# Patient Record
Sex: Female | Born: 1955 | Race: White | Hispanic: No | Marital: Married | Smoking: Never smoker
Health system: Southern US, Community
[De-identification: ages and names within clinical notes are randomized; demographics above are authoritative.]

## PROBLEM LIST (undated history)

## (undated) HISTORY — PX: TONSILLECTOMY: SHX5217

## (undated) HISTORY — PX: TUBAL LIGATION: SHX77

---

## 2005-12-14 ENCOUNTER — Encounter: Admission: RE | Admit: 2005-12-14 | Discharge: 2005-12-14 | Payer: Self-pay | Admitting: Obstetrics and Gynecology

## 2006-01-25 ENCOUNTER — Encounter: Admission: RE | Admit: 2006-01-25 | Discharge: 2006-01-25 | Payer: Self-pay | Admitting: Obstetrics and Gynecology

## 2006-01-31 ENCOUNTER — Encounter: Admission: RE | Admit: 2006-01-31 | Discharge: 2006-01-31 | Payer: Self-pay | Admitting: Obstetrics and Gynecology

## 2006-02-22 ENCOUNTER — Encounter: Admission: RE | Admit: 2006-02-22 | Discharge: 2006-02-22 | Payer: Self-pay | Admitting: Obstetrics and Gynecology

## 2006-04-02 IMAGING — US IR TRANSCATH EMBOLIZATION NON-NEURO EA OP FIELD
1 series · 5 of 5 positions shown · non-contrast
Comparison: none

CLINICAL DATA: Symptomatic left lower extremity varicose veins secondary to
valvular incompetence and reflux in short and greater saphenous veins.

[Series 1: unknown · 0.09mm/px · 5 of 5 slices shown]
[im 1/5]
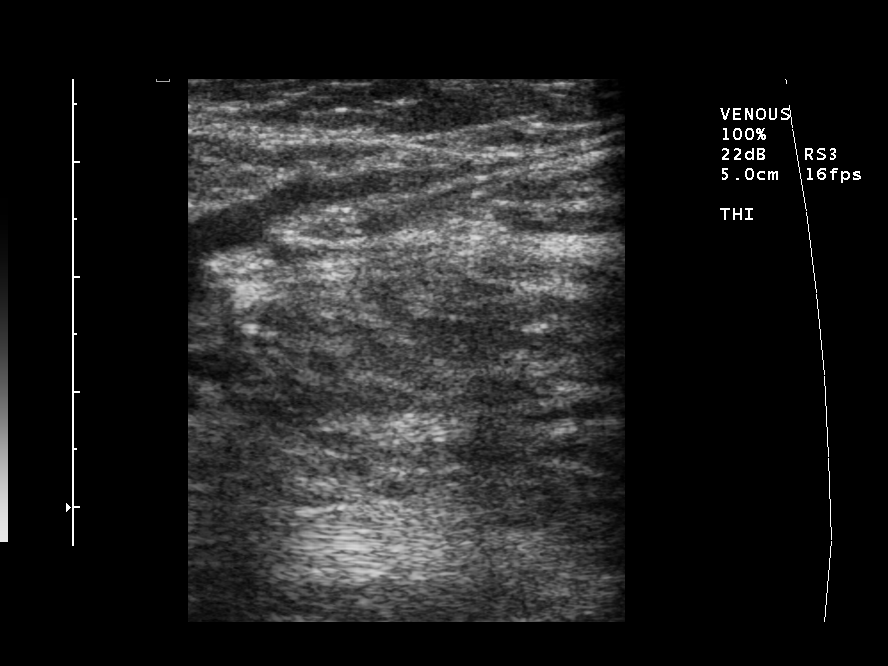
[im 2/5]
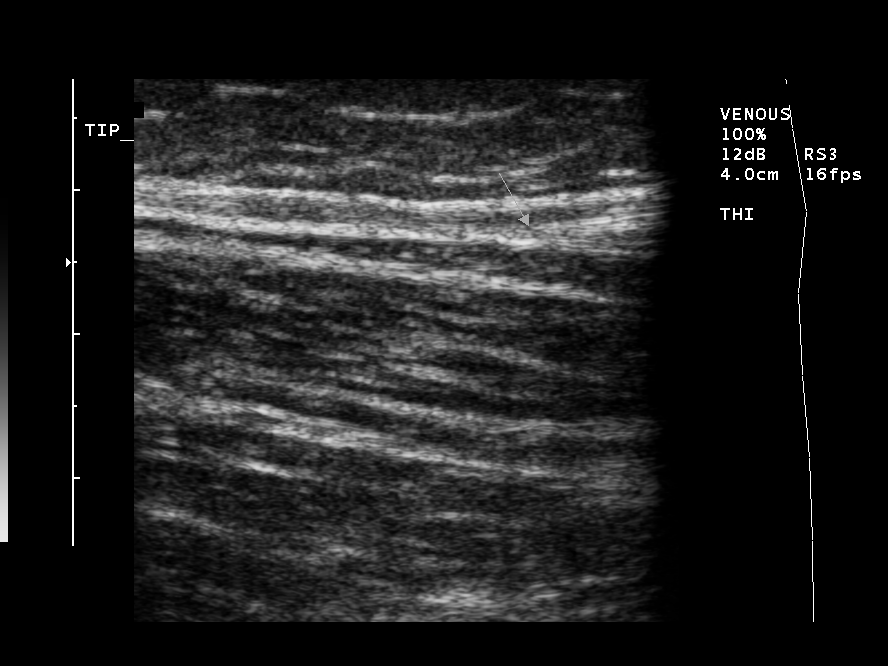
[im 3/5]
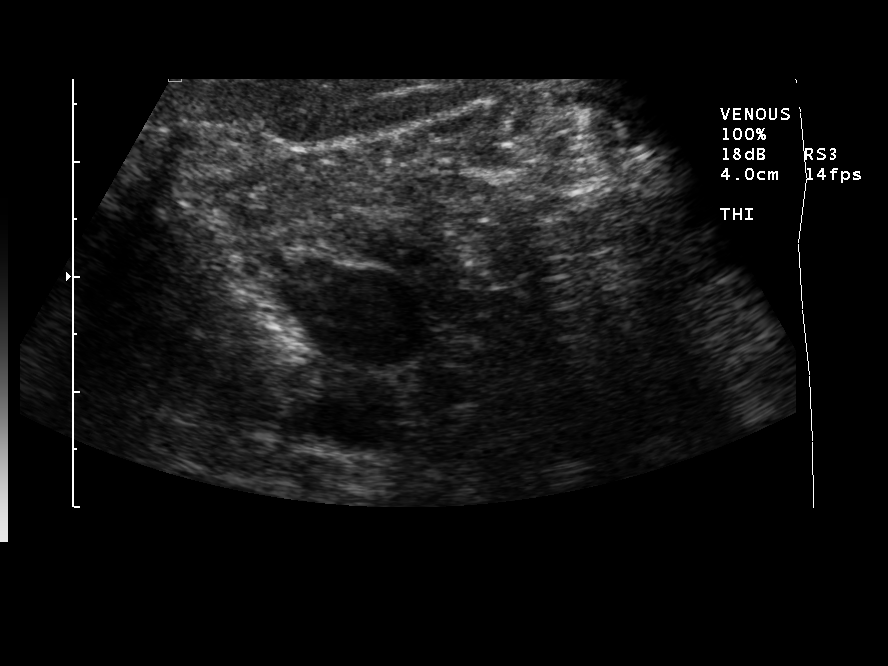
[im 4/5]
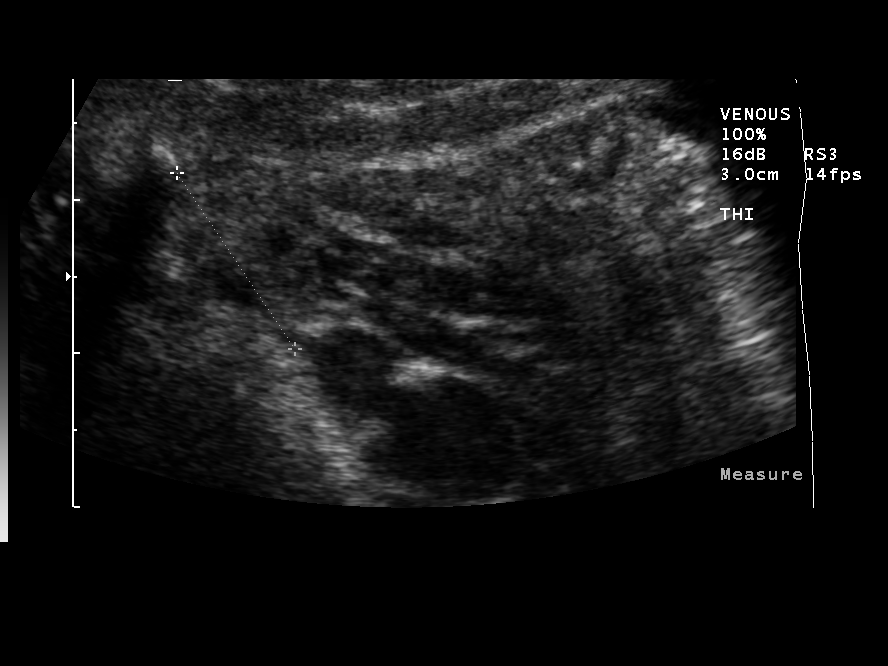
[im 5/5]
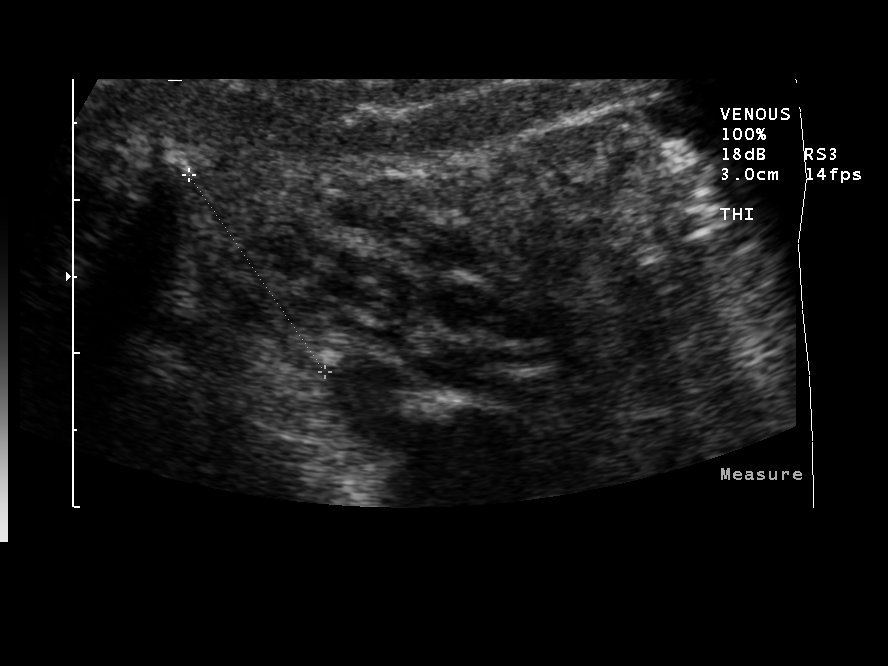

[5 of 5 positions shown; findings below may reference images not displayed]

TRANSCATHETER LASER OCCLUSION  LEFT  GREATER AND SMALL SAPHENOUS VEINS WITH
ULTRASOUND GUIDANCE:

Survey ultrasound of the leg was performed and the course of the small and
greater saphenous veins was marked on the skin. Overlying skin prepped with
Betadine, draped in usual sterile fashion. After local anesthetic using 1%
lidocaine, the greater saphenous vein was accessed just above the ankle level
under ultrasound with a 21-gauge micropuncture needle. A 018 guidewire advanced
easily. This was exchanged using a transitional dilator for a 035 J wire. Over
this, the 6 French delivery sheath was advanced beyond the saphenofemoral
junction. The laser fiber was advanced and positioned greater than 15 mm  from
the saphenofemoral junction. The small saphenous vein was accessed in similar
fashion under ultrasound with a 21-gauge micropuncture needle, exchanged over a
micropuncture wire for transitional dilator. Tumescent dilute 0.1% lidocaine 350
mL used along the planned GSV treatment length. Ultrasound was used to confirm
appropriate tumescent anesthesia and to verify that all segments were at least 1
cm from the skin surface. The laser fiber was activated while being withdrawn
over the treatment length, delivering 1982 joules over 304 seconds. The catheter
and sheath were removed and hemostasis easily achieved at the site. 
The transitional dilator in the small saphenous vein was used for advancement of
the 035 J wire. Over this, the 6 French delivery sheath was advanced beyond to
just below the saphenopopliteal junction. The laser fiber was advanced and
positioned greater than 15 mm  from the saphenopopliteal junction.   Tumescent
dilute 0.1% lidocaine 150 mL used along the planned SSV treatment length.
Ultrasound was used to confirm appropriate tumescent anesthesia and to verify
that all segments were at least 1 cm from the skin surface. The laser fiber was
activated while being withdrawn over the treatment length, delivering 1868
joules over 171 seconds. The catheter and sheath were removed and hemostasis
easily achieved at the site.
Patient was placed in graduated compression stockings and ambulated for 20
minutes without difficulty. Patient tolerated the procedure well, with no
immediate complication.
IMPRESSION: 1. Technically successful transcatheter laser occlusion of left small and
greater saphenous veins. Patient will followup in clinic in one week. Patient
knows to telephone should there be any interval questions or problems.

## 2006-04-08 IMAGING — US US EXTREM LOW VENOUS*L*
1 series · 14 of 24 positions shown · non-contrast
Comparison: none

CLINICAL DATA: One week status post left GSV and SSV transcatheter laser occlusion for venous insufficiency and varicose veins.
LEFT LOWER EXTREMITY VENOUS DOPPLER ULTRASOUND:
TECHNIQUE: Gray-scale sonography with compression as well as color and duplex Doppler ultrasound were performed to evaluate the deep and superficial venous systems from the level of the common femoral vein through the popliteal and proximal calf veins.

[Series 1: unknown · 14 of 30 slices shown]
[im 1/30]
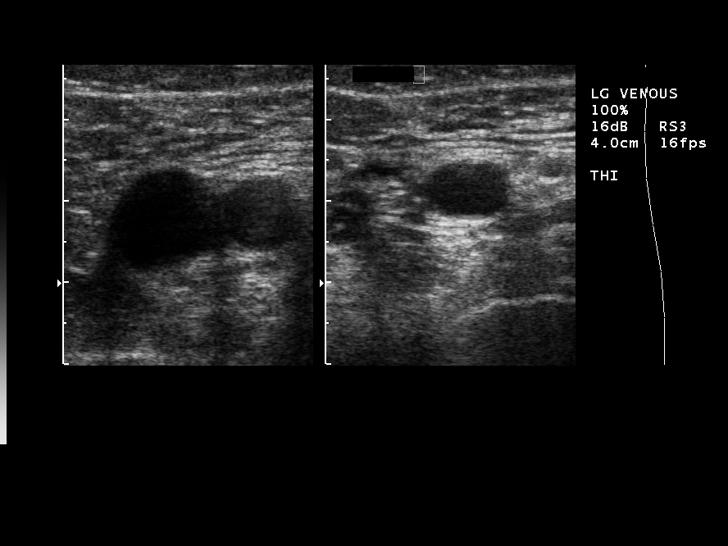
[im 3/30]
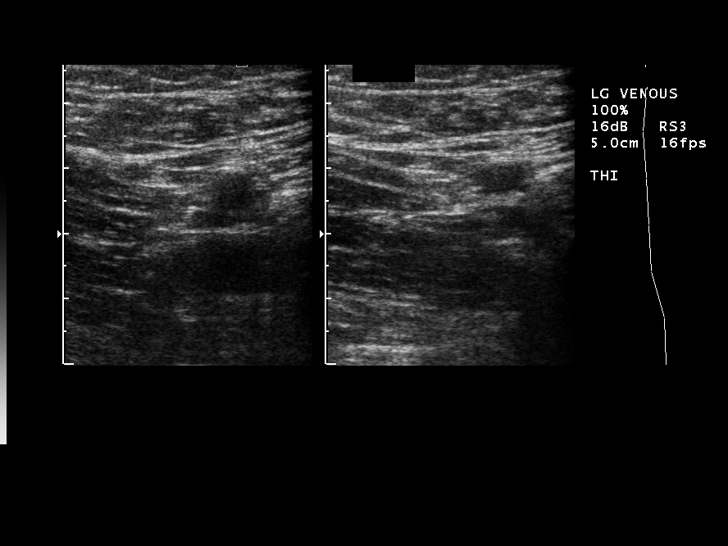
[im 6/30]
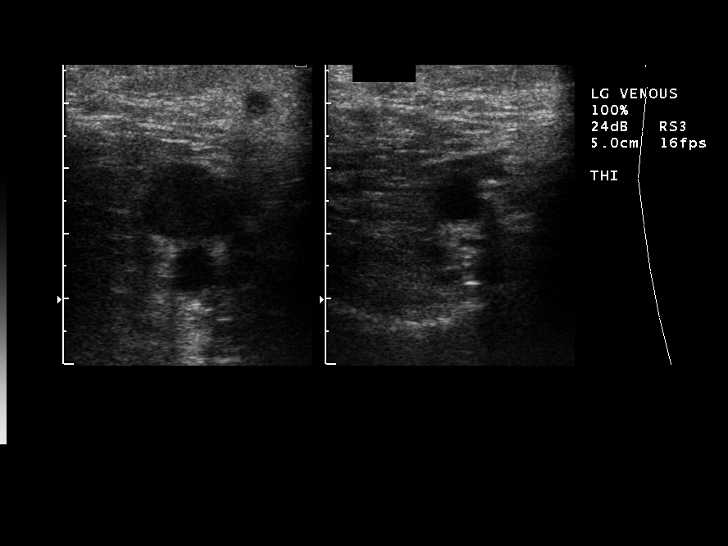
[im 8/30]
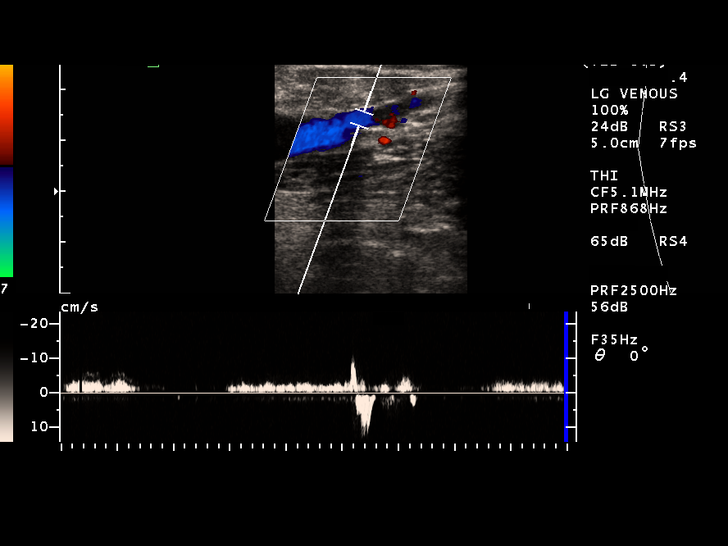
[im 9/30]
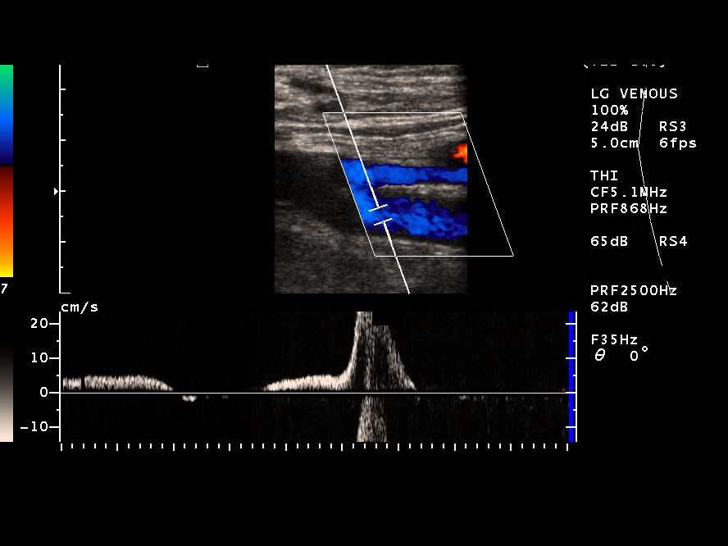
[im 12/30]
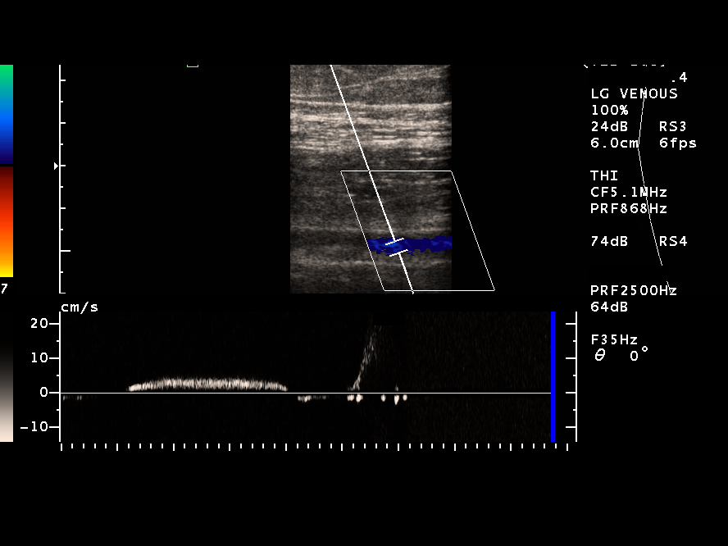
[im 14/30]
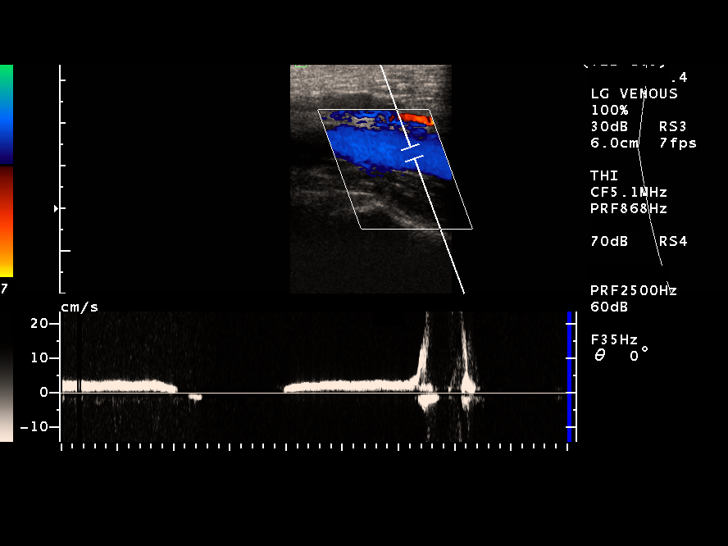
[im 16/30]
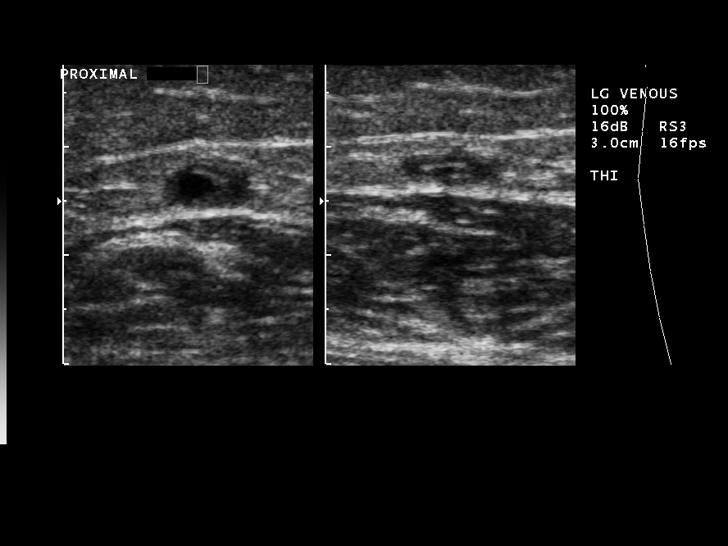
[im 18/30]
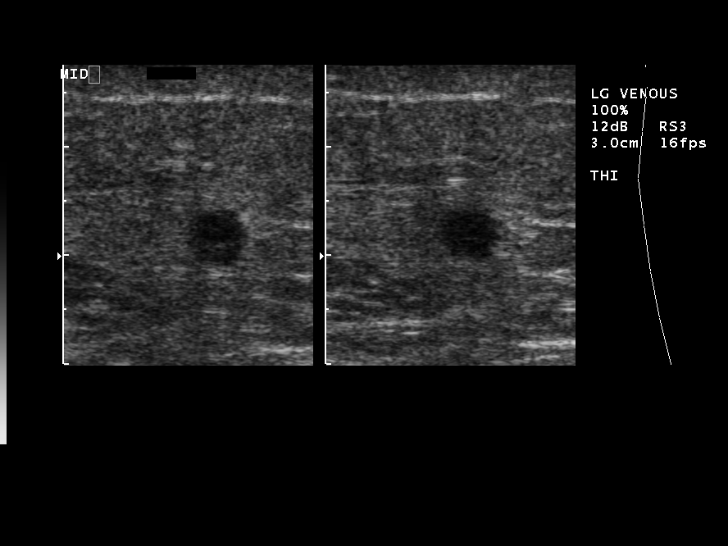
[im 21/30]
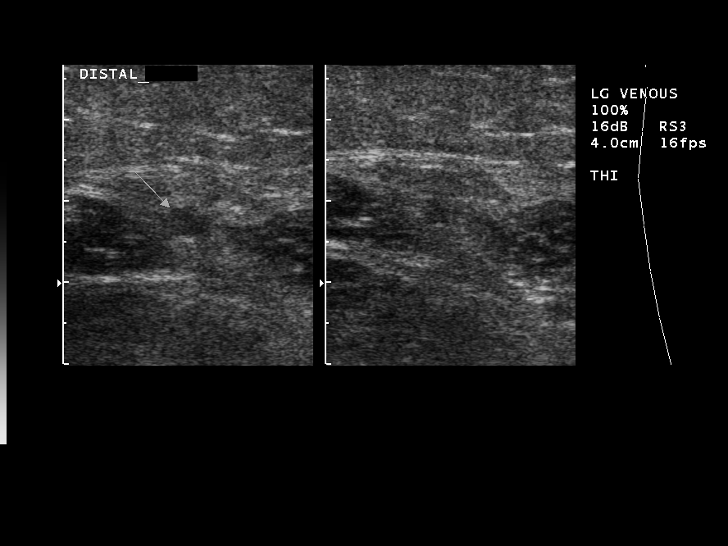
[im 23/30]
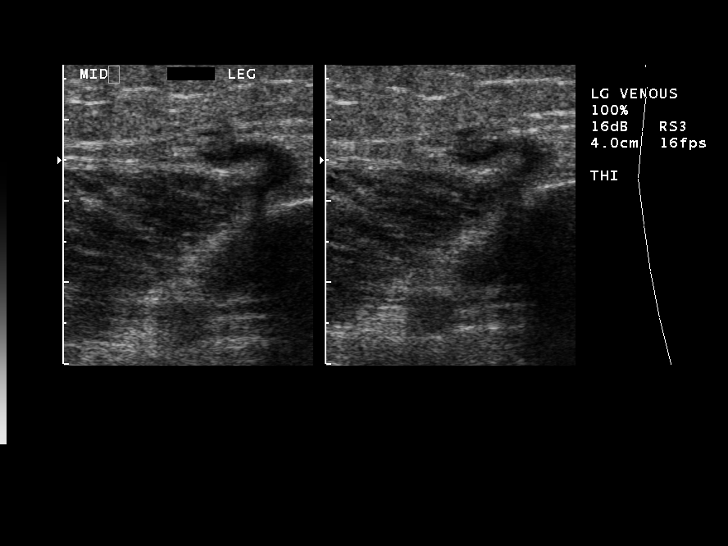
[im 24/30]
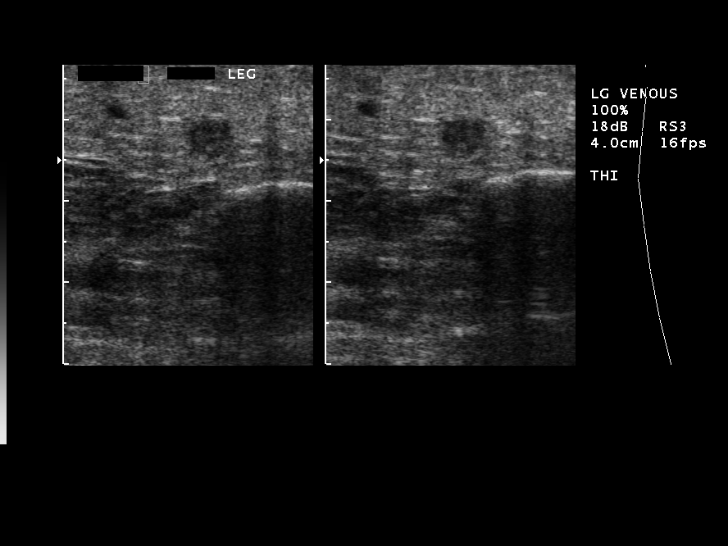
[im 27/30]
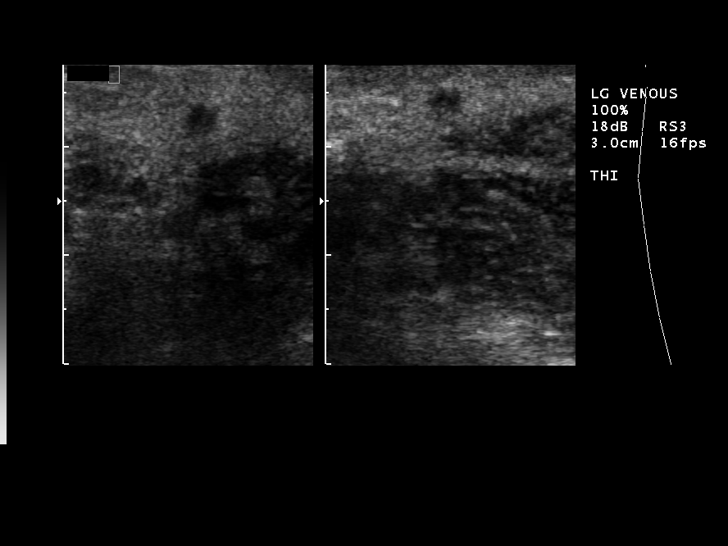
[im 30/30]
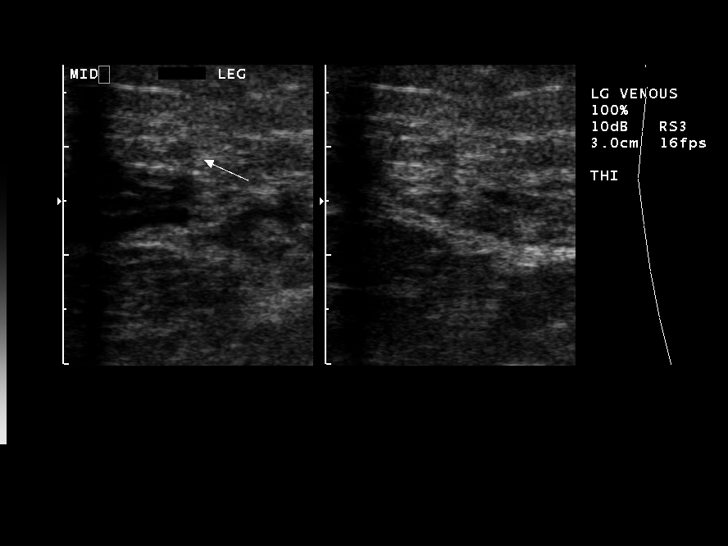

[14 of 24 positions shown; findings below may reference images not displayed]

FINDINGS: The left common femoral, femoral, and popliteal veins demonstrate normal compressibility and augmentation without DVT.  The treated segments of the left GSV and SSV remain occluded.   The varicose veins are beginning to decompress.  No delayed complication, or early recanalization.
IMPRESSION: 1.  Status post left GSV and SSV transcatheter laser occlusion.   The treated segments remain occluded at one week.
2.  No DVT.

## 2006-07-27 ENCOUNTER — Encounter: Admission: RE | Admit: 2006-07-27 | Discharge: 2006-07-27 | Payer: Self-pay | Admitting: Interventional Radiology

## 2006-10-02 IMAGING — US US EXTREM LOW VENOUS*L*
1 series · 14 of 23 positions shown · non-contrast
Comparison: none

CLINICAL DATA: Symptomatic left leg varicose veins, now 6 months status post
transcatheter laser occlusion of the   greater and small saphenous veins.

Left lower extremity venous Doppler ultrasound:
TECHNIQUE: Gray-scale sonography with compression as well as color and duplex
Doppler ultrasound were performed to evaluate   the saphenous vein and the deep
venous system from the level of the common femoral vein through the popliteal
and proximal calf veins.

[Series 1: unknown · 14 of 23 slices shown]
[im 1/23]
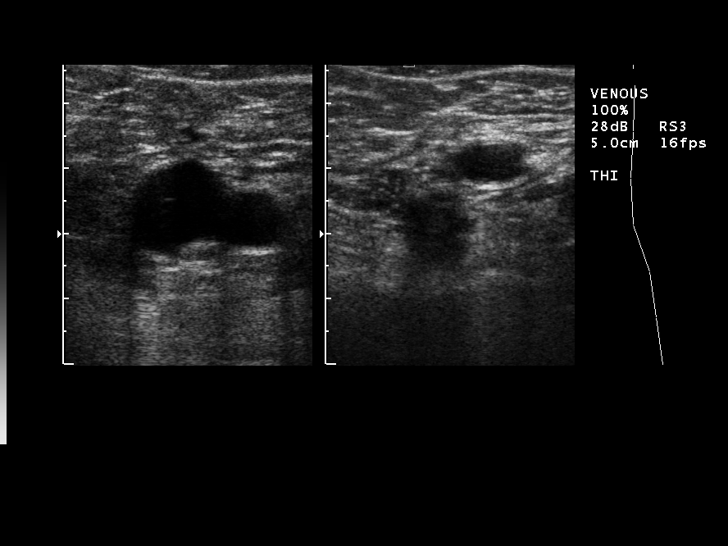
[im 3/23]
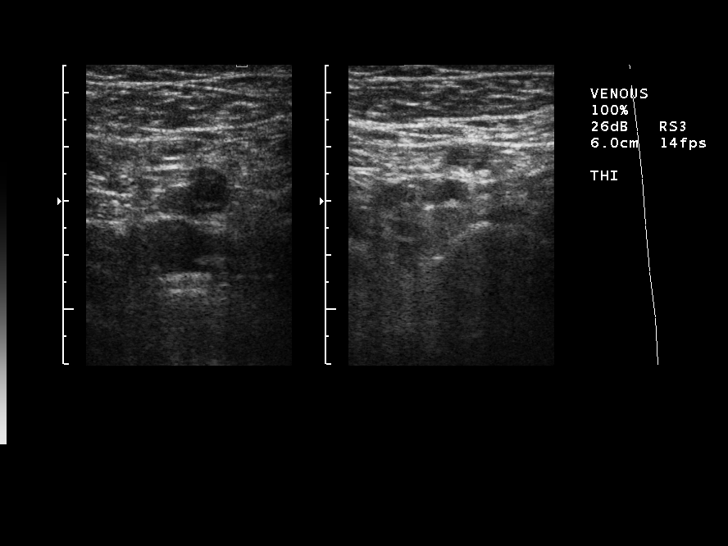
[im 5/23]
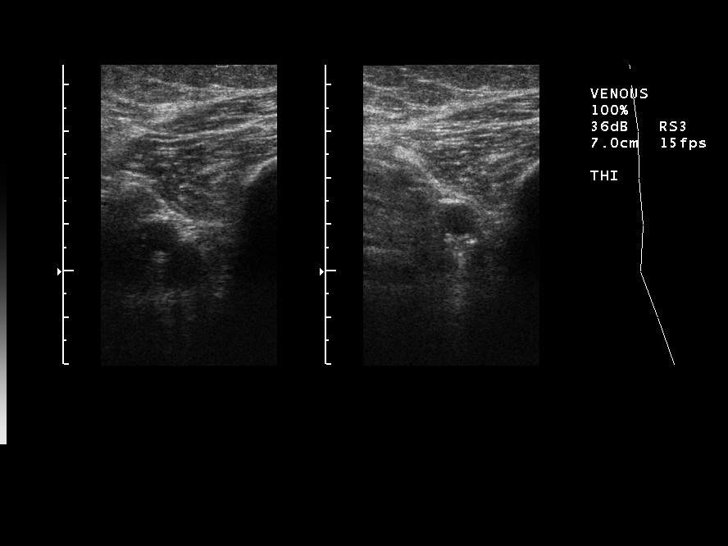
[im 6/23]
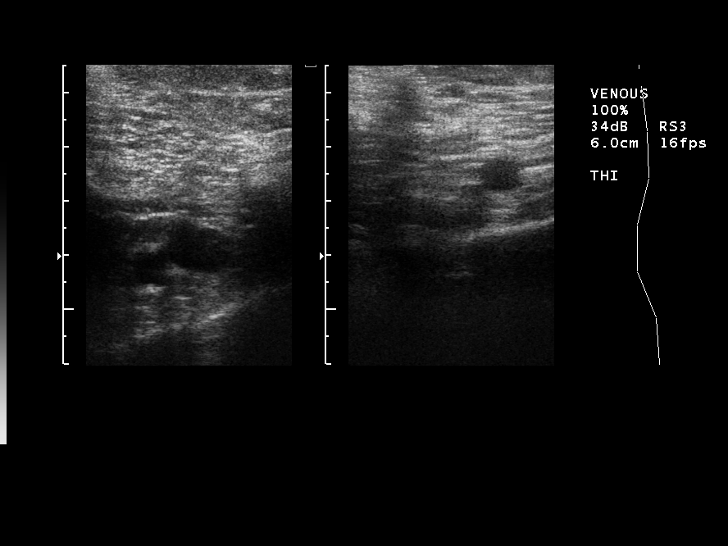
[im 8/23]
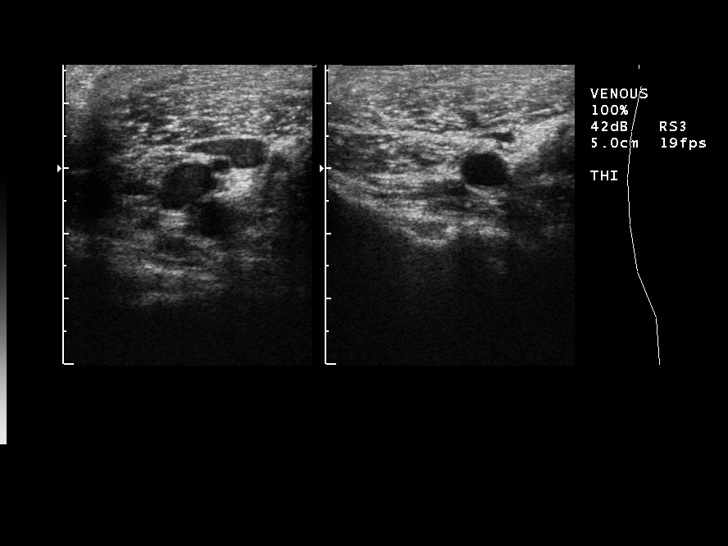
[im 10/23]
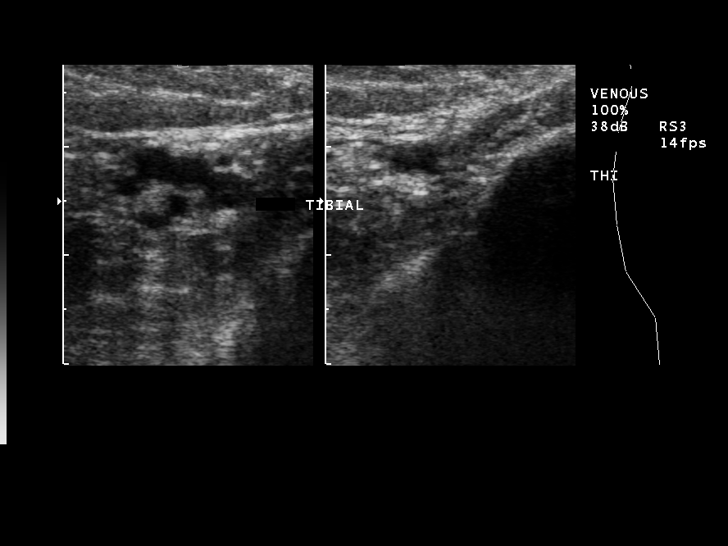
[im 11/23]
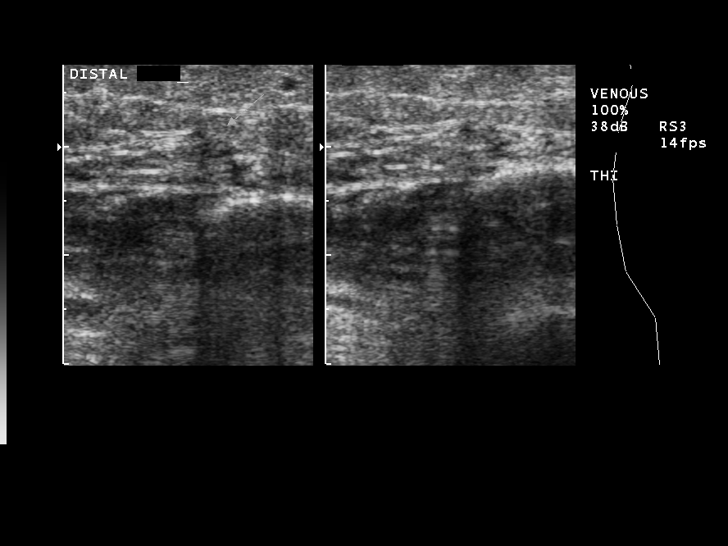
[im 13/23]
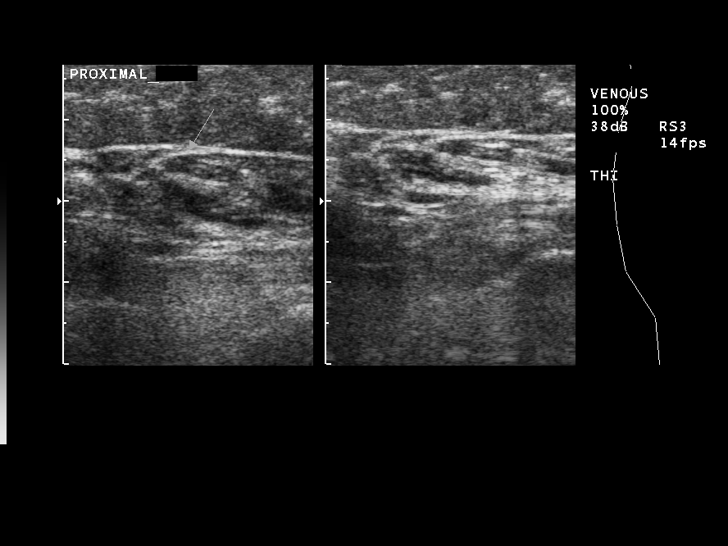
[im 14/23]
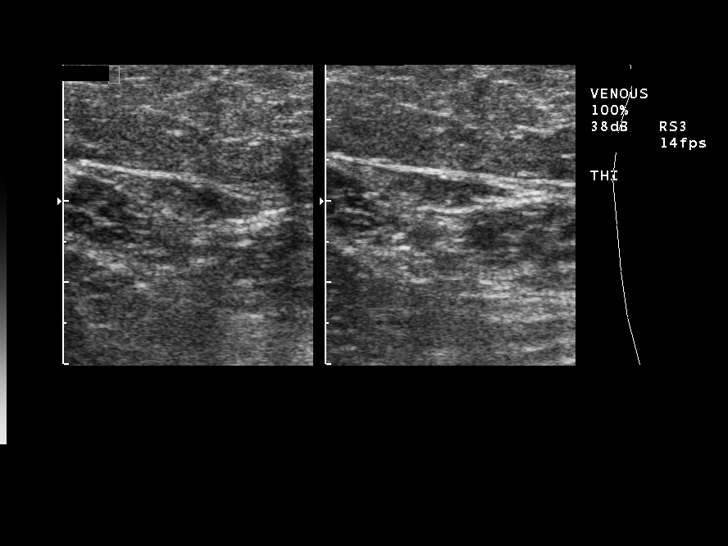
[im 16/23]
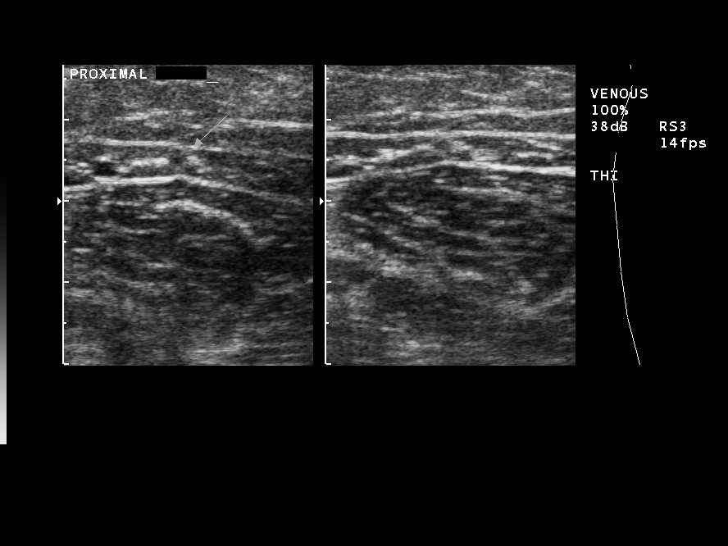
[im 18/23]
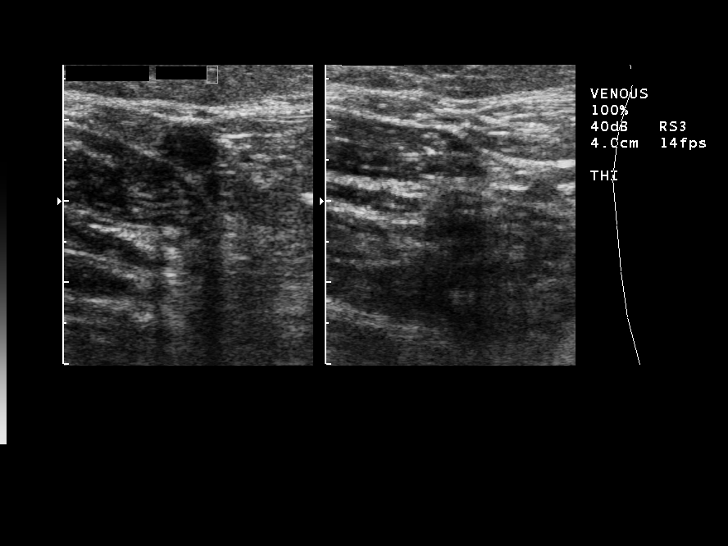
[im 19/23]
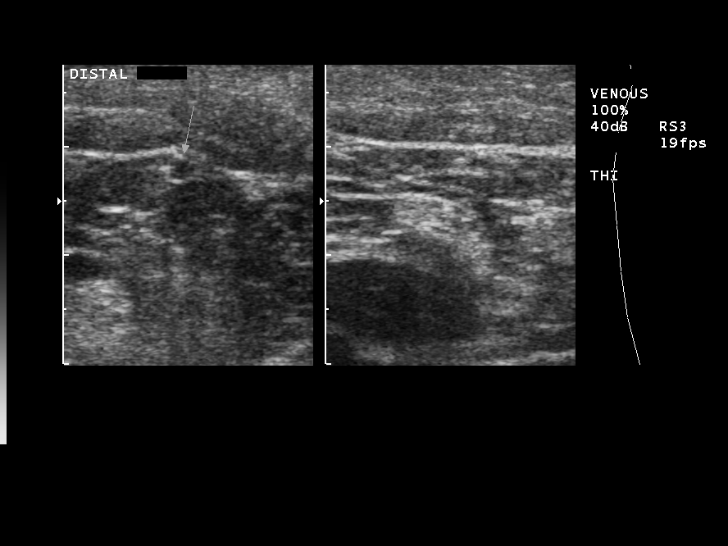
[im 21/23]
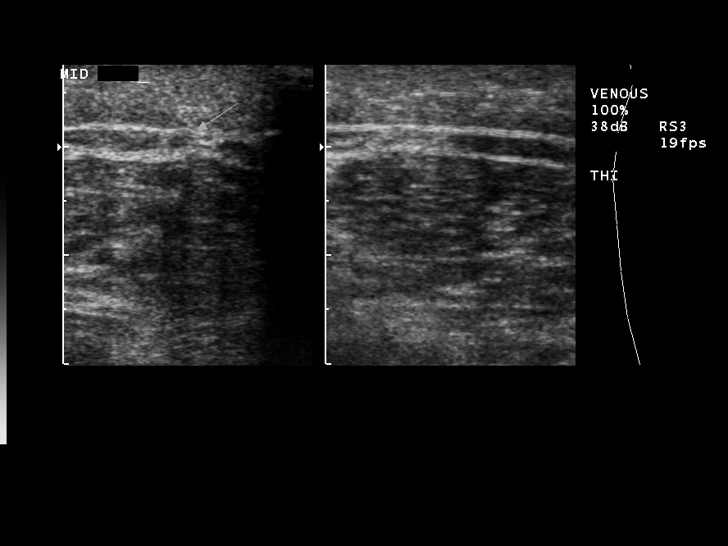
[im 23/23]
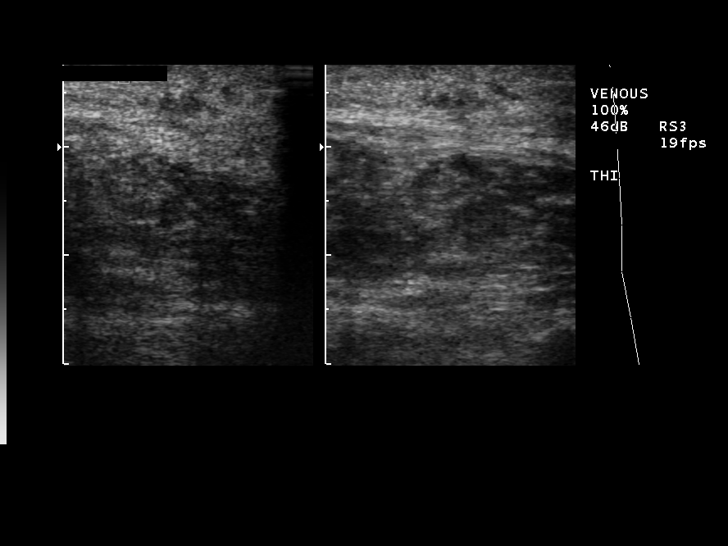

[14 of 23 positions shown; findings below may reference images not displayed]

FINDINGS: The lower extremity deep venous system demonstrates normal
compressibility, phasicity, and augmentation. Posterior tibial vein
unremarkable. No evidence of DVT.

The greater and small saphenous veins occluded throughout the treatment
segments. Associated varicose veins in the thigh and calf region are
decompressed.
IMPRESSION: 1. Technically successful occlusion of the greater and small saphenous veins
along the treatment length without apparent complication.
2. Normal deep venous system. No DVT.

## 2007-01-18 ENCOUNTER — Encounter: Admission: RE | Admit: 2007-01-18 | Discharge: 2007-01-18 | Payer: Self-pay | Admitting: Interventional Radiology

## 2007-01-29 ENCOUNTER — Encounter: Admission: RE | Admit: 2007-01-29 | Discharge: 2007-01-29 | Payer: Self-pay | Admitting: Interventional Radiology

## 2014-06-30 ENCOUNTER — Encounter (INDEPENDENT_AMBULATORY_CARE_PROVIDER_SITE_OTHER): Payer: Self-pay | Admitting: *Deleted

## 2014-07-01 ENCOUNTER — Encounter (INDEPENDENT_AMBULATORY_CARE_PROVIDER_SITE_OTHER): Payer: Self-pay

## 2016-06-08 DIAGNOSIS — L280 Lichen simplex chronicus: Secondary | ICD-10-CM | POA: Diagnosis not present

## 2016-06-08 DIAGNOSIS — I868 Varicose veins of other specified sites: Secondary | ICD-10-CM | POA: Diagnosis not present

## 2016-06-08 DIAGNOSIS — D2272 Melanocytic nevi of left lower limb, including hip: Secondary | ICD-10-CM | POA: Diagnosis not present

## 2016-07-07 DIAGNOSIS — L905 Scar conditions and fibrosis of skin: Secondary | ICD-10-CM | POA: Diagnosis not present

## 2016-07-07 DIAGNOSIS — D485 Neoplasm of uncertain behavior of skin: Secondary | ICD-10-CM | POA: Diagnosis not present

## 2016-09-29 DIAGNOSIS — Z23 Encounter for immunization: Secondary | ICD-10-CM | POA: Diagnosis not present

## 2016-12-30 DIAGNOSIS — Z1231 Encounter for screening mammogram for malignant neoplasm of breast: Secondary | ICD-10-CM | POA: Diagnosis not present

## 2017-05-05 DIAGNOSIS — Z6825 Body mass index (BMI) 25.0-25.9, adult: Secondary | ICD-10-CM | POA: Diagnosis not present

## 2017-05-05 DIAGNOSIS — R300 Dysuria: Secondary | ICD-10-CM | POA: Diagnosis not present

## 2017-05-16 DIAGNOSIS — I8312 Varicose veins of left lower extremity with inflammation: Secondary | ICD-10-CM | POA: Diagnosis not present

## 2017-05-16 DIAGNOSIS — I83893 Varicose veins of bilateral lower extremities with other complications: Secondary | ICD-10-CM | POA: Diagnosis not present

## 2017-05-16 DIAGNOSIS — I83812 Varicose veins of left lower extremities with pain: Secondary | ICD-10-CM | POA: Diagnosis not present

## 2017-07-10 DIAGNOSIS — L570 Actinic keratosis: Secondary | ICD-10-CM | POA: Diagnosis not present

## 2017-07-10 DIAGNOSIS — D239 Other benign neoplasm of skin, unspecified: Secondary | ICD-10-CM | POA: Diagnosis not present

## 2017-07-10 DIAGNOSIS — D485 Neoplasm of uncertain behavior of skin: Secondary | ICD-10-CM | POA: Diagnosis not present

## 2017-07-21 DIAGNOSIS — Z0000 Encounter for general adult medical examination without abnormal findings: Secondary | ICD-10-CM | POA: Diagnosis not present

## 2017-07-21 DIAGNOSIS — R000 Tachycardia, unspecified: Secondary | ICD-10-CM | POA: Diagnosis not present

## 2017-07-21 DIAGNOSIS — E782 Mixed hyperlipidemia: Secondary | ICD-10-CM | POA: Diagnosis not present

## 2017-07-21 DIAGNOSIS — M1991 Primary osteoarthritis, unspecified site: Secondary | ICD-10-CM | POA: Diagnosis not present

## 2017-07-27 DIAGNOSIS — Z23 Encounter for immunization: Secondary | ICD-10-CM | POA: Diagnosis not present

## 2017-07-27 DIAGNOSIS — Z0001 Encounter for general adult medical examination with abnormal findings: Secondary | ICD-10-CM | POA: Diagnosis not present

## 2017-07-27 DIAGNOSIS — Z1212 Encounter for screening for malignant neoplasm of rectum: Secondary | ICD-10-CM | POA: Diagnosis not present

## 2017-07-27 DIAGNOSIS — Z6825 Body mass index (BMI) 25.0-25.9, adult: Secondary | ICD-10-CM | POA: Diagnosis not present

## 2018-07-30 DIAGNOSIS — R000 Tachycardia, unspecified: Secondary | ICD-10-CM | POA: Diagnosis not present

## 2018-07-30 DIAGNOSIS — E782 Mixed hyperlipidemia: Secondary | ICD-10-CM | POA: Diagnosis not present

## 2018-08-02 DIAGNOSIS — Z6825 Body mass index (BMI) 25.0-25.9, adult: Secondary | ICD-10-CM | POA: Diagnosis not present

## 2018-08-02 DIAGNOSIS — Z0001 Encounter for general adult medical examination with abnormal findings: Secondary | ICD-10-CM | POA: Diagnosis not present

## 2018-08-02 DIAGNOSIS — E782 Mixed hyperlipidemia: Secondary | ICD-10-CM | POA: Diagnosis not present

## 2018-08-02 DIAGNOSIS — Z23 Encounter for immunization: Secondary | ICD-10-CM | POA: Diagnosis not present

## 2018-08-02 DIAGNOSIS — Z1212 Encounter for screening for malignant neoplasm of rectum: Secondary | ICD-10-CM | POA: Diagnosis not present

## 2021-07-29 ENCOUNTER — Other Ambulatory Visit: Payer: Self-pay

## 2021-07-29 DIAGNOSIS — M7989 Other specified soft tissue disorders: Secondary | ICD-10-CM

## 2021-08-09 ENCOUNTER — Other Ambulatory Visit: Payer: Self-pay

## 2021-08-09 ENCOUNTER — Ambulatory Visit (HOSPITAL_COMMUNITY)
Admission: RE | Admit: 2021-08-09 | Discharge: 2021-08-09 | Disposition: A | Discharge status: Home / Self Care | Payer: Medicare Other | Source: Ambulatory Visit | Attending: Family Medicine | Admitting: Family Medicine

## 2021-08-09 DIAGNOSIS — M7989 Other specified soft tissue disorders: Secondary | ICD-10-CM | POA: Diagnosis present

## 2021-08-15 NOTE — Progress Notes (Signed)
[facility] [place]  ASSESSMENT / PLAN: Sharon Mckinney is a 65 y.o. female with chronic venous insufficiency of bilateral lower extremity causing varicosities (C2 disease).  Venous duplex is significant for previous stripping of R GSV. Deep venous reflux. Recommend compression and elevation for symptomatic relief. Counseled that her disease is benign and can be managed conservatively. Follow up with me as needed.  CHIEF COMPLAINT: Varicosities after endovenous ablation  HISTORY OF PRESENT ILLNESS: Sharon Mckinney is a 65 y.o. female who presents to clinic for evaluation of venous varicosities left lower extremity.  She previously underwent greater saphenous vein ablation at another facility.  She reports she has developed new varicosities about her leg and wanted to be evaluated for these.  She has minimal symptoms from these varicosities.  She does not report swelling or pain.  She does have reticular veins about her legs as well which are only cosmetically annoying to her.  History reviewed. No pertinent past medical history.  Past Surgical History:  Procedure Laterality Date   TONSILLECTOMY     TUBAL LIGATION      History reviewed. No pertinent family history.  Social History   Socioeconomic History   Marital status: Married    Spouse name: Not on file   Number of children: Not on file   Years of education: Not on file   Highest education level: Not on file  Occupational History   Not on file  Tobacco Use   Smoking status: Never   Smokeless tobacco: Never  Vaping Use   Vaping Use: Never used  Substance and Sexual Activity   Alcohol use: Yes    Comment: occasional   Drug use: Never   Sexual activity: Not on file  Other Topics Concern   Not on file  Social History Narrative   Not on file   Social Determinants of Health   Financial Resource Strain: Not on file  Food Insecurity: Not on file  Transportation Needs: Not on file  Physical  Activity: Not on file  Stress: Not on file  Social Connections: Not on file  Intimate Partner Violence: Not on file    Allergies  Allergen Reactions   Erythromycin Rash   Penicillins Rash    Current Outpatient Medications  Medication Sig Dispense Refill   triamcinolone ointment (KENALOG) 0.5 % Apply topically daily as needed.     No current facility-administered medications for this visit.    REVIEW OF SYSTEMS:  [X]  denotes positive finding, [ ]  denotes negative finding Cardiac  Comments:  Chest pain or chest pressure:    Shortness of breath upon exertion:    Short of breath when lying flat:    Irregular heart rhythm:        Vascular    Pain in calf, thigh, or hip brought on by ambulation:    Pain in feet at night that wakes you up from your sleep:     Blood clot in your veins:    Leg swelling:         Pulmonary    Oxygen at home:    Productive cough:     Wheezing:         Neurologic    Sudden weakness in arms or legs:     Sudden numbness in arms or legs:     Sudden onset of difficulty speaking or slurred speech:    Temporary loss of vision in one eye:     Problems with dizziness:  Gastrointestinal    Blood in stool:     Vomited blood:         Genitourinary    Burning when urinating:     Blood in urine:        Psychiatric    Major depression:         Hematologic    Bleeding problems:    Problems with blood clotting too easily:        Skin    Rashes or ulcers:        Constitutional    Fever or chills:      PHYSICAL EXAM Vitals:   08/17/21 0958  BP: (!) 149/93  Pulse: 85  Resp: 20  Temp: 97.9 F (36.6 C)  SpO2: 99%  Weight: 184 lb (83.5 kg)  Height: 5' 10.5" (1.791 m)    Constitutional: well appearing. no distress. Appears well nourished.  Neurologic: CN intact. no focal findings. no sensory loss. Psychiatric:  Mood and affect symmetric and appropriate. Eyes:  No icterus. No conjunctival pallor. Ears, nose, throat:  mucous  membranes moist. Midline trachea.  Cardiac: regular rate and rhythm.  Respiratory:  unlabored. Abdominal:  soft, non-tender, non-distended.  Peripheral vascular: 2+ DP pulses. Two areas of varicosity about the left proximal calf / distal thigh. Scattered reticular veins. Extremity: No edema. No cyanosis. No pallor.  Skin: No gangrene. No ulceration.  Lymphatic: No Stemmer's sign. No palpable lymphadenopathy.  PERTINENT LABORATORY AND RADIOLOGIC DATA Lower Venous Reflux Study   Patient Name:  Sharon Mckinney  Date of Exam:   08/09/2021  Medical Rec #: [id number]         Accession #:    [id number]  Date of Birth: [birth year]          Patient Gender: F  Patient Age:   47 years  Exam Location:  [facility]  Procedure:      VAS [place] LOWER EXTREMITY VENOUS REFLUX  Referring Phys: Heath Lark    ---------------------------------------------------------------------------  -----     Indications: Varicosities.   Other Indications: History of left GSV and SSV ablation.   Comparison Study: 07/27/2006: Lt- No evidence of deep vein thrombosis.                    Successful ablation of the great saphenous vein and  short                    saphenous vein.   Performing Technologist: Ethelle Lyon      Examination Guidelines: A complete evaluation includes B-mode imaging,  spectral  Doppler, color Doppler, and power Doppler as needed of all accessible  portions  of each vessel. Bilateral testing is considered an integral part of a  complete  examination. Limited examinations for reoccurring indications may be  performed  as noted. The reflux portion of the exam is performed with the patient in  reverse Trendelenburg.  Significant venous reflux is defined as >500 ms in the superficial venous  system, and >1 second in the deep venous system.      +------------------+---------+------+-----------+------------+-------------  ----+  LEFT              Reflux  NoRefluxReflux TimeDiameter cmsComments                                        Yes                                             +------------------+---------+------+-----------+------------+-------------  ----+  CFV                         yes   >1 second                                 +------------------+---------+------+-----------+------------+-------------  ----+  FV mid            no                                                        +------------------+---------+------+-----------+------------+-------------  ----+  Popliteal                   yes   >1 second                                 +------------------+---------+------+-----------+------------+-------------  ----+  GSV at [facility]                  yes    >500 ms      0.43                        +------------------+---------+------+-----------+------------+-------------  ----+  GSV prox thigh    no                            0.37                        +------------------+---------+------+-----------+------------+-------------  ----+  GSV mid thigh                                           prior                                                                          ablation/strippin                                                          g                    +------------------+---------+------+-----------+------------+-------------  ----+  GSV dist thigh                                          prior  ablation/strippin                                                          g                    +------------------+---------+------+-----------+------------+-------------  ----+  GSV at knee                                             prior                                                                          ablation/strippin                                                           g                    +------------------+---------+------+-----------+------------+-------------  ----+  GSV prox calf                                           prior                                                                          ablation/strippin                                                          g                    +------------------+---------+------+-----------+------------+-------------  ----+  GSV mid calf                                            prior  ablation/strippin                                                          g                    +------------------+---------+------+-----------+------------+-------------  ----+  GSV dist calf               yes    >500 ms      0.32                        +------------------+---------+------+-----------+------------+-------------  ----+  Distal calf                 yes    >500 ms                                  perforator                                                                  +------------------+---------+------+-----------+------------+-------------  ----+  Distal calf                 yes    >500 ms                                  perforator                                                                  +------------------+---------+------+-----------+------------+-------------  ----+           Summary:  Left:  - No evidence of deep vein thrombosis seen in the left lower extremity,  from the common femoral through the popliteal veins.     - Venous reflux is noted in the left common femoral vein.  - Venous reflux is noted in the left sapheno-femoral junction.  - Venous reflux is noted in the left greater saphenous vein in the distal  calf.  - Venous reflux is noted in the left  popliteal vein.  - Venous reflux is noted in the left perforator veins in the calf.     - GSV not visualized from the proximal thigh to the mid/distal calf(prior  ablation). SSV not visualized (prior ablation).     *See table(s) above for measurements and observations.   Electronically signed by Coral Else MD on 08/09/2021 at 5:56:37 PM.   Rande Brunt. Lenell Antu, MD [facility] Phone Number: [phone] 08/17/2021 1:10 PM  Total time spent on preparing this encounter including chart review, data review, collecting history, examining the patient, coordinating care for this new patient, 45 minutes.  Portions of this report may have been transcribed using voice  recognition software.  Every effort has been made to ensure accuracy; however, inadvertent computerized transcription errors may still be present.

## 2021-08-17 ENCOUNTER — Ambulatory Visit (INDEPENDENT_AMBULATORY_CARE_PROVIDER_SITE_OTHER): Payer: Medicare Other | Admitting: Vascular Surgery

## 2021-08-17 ENCOUNTER — Encounter: Payer: Self-pay | Admitting: Vascular Surgery

## 2021-08-17 ENCOUNTER — Other Ambulatory Visit: Payer: Self-pay

## 2021-08-17 VITALS — BP 149/93 | HR 85 | Temp 97.9°F | Resp 20 | Ht 70.5 in | Wt 184.0 lb

## 2021-08-17 DIAGNOSIS — I872 Venous insufficiency (chronic) (peripheral): Secondary | ICD-10-CM | POA: Diagnosis not present
